# Patient Record
Sex: Female | Born: 1970 | Race: Black or African American | Hispanic: No | Smoking: Current every day smoker
Health system: Southern US, Community
[De-identification: ages and names within clinical notes are randomized; demographics above are authoritative.]

## PROBLEM LIST (undated history)

## (undated) DIAGNOSIS — N200 Calculus of kidney: Secondary | ICD-10-CM

---

## 2016-07-03 ENCOUNTER — Encounter (HOSPITAL_COMMUNITY): Payer: Self-pay | Admitting: Emergency Medicine

## 2016-07-03 ENCOUNTER — Emergency Department (HOSPITAL_COMMUNITY)
Admission: EM | Admit: 2016-07-03 | Discharge: 2016-07-03 | Disposition: A | Discharge status: Home / Self Care | Payer: Self-pay | Attending: Emergency Medicine | Admitting: Emergency Medicine

## 2016-07-03 ENCOUNTER — Emergency Department (HOSPITAL_COMMUNITY): Payer: Self-pay

## 2016-07-03 DIAGNOSIS — N134 Hydroureter: Secondary | ICD-10-CM | POA: Insufficient documentation

## 2016-07-03 LAB — CBC
HCT: 41.6 % (ref 36.0–46.0)
Hemoglobin: 13.2 g/dL (ref 12.0–15.0)
MCH: 24.3 pg — ABNORMAL LOW (ref 26.0–34.0)
MCHC: 31.7 g/dL (ref 30.0–36.0)
MCV: 76.6 fL — ABNORMAL LOW (ref 78.0–100.0)
Platelets: 475 10*3/uL — ABNORMAL HIGH (ref 150–400)
RBC: 5.43 MIL/uL — ABNORMAL HIGH (ref 3.87–5.11)
RDW: 15.3 % (ref 11.5–15.5)
WBC: 7.4 10*3/uL (ref 4.0–10.5)

## 2016-07-03 LAB — PREGNANCY, URINE: Preg Test, Ur: NEGATIVE

## 2016-07-03 LAB — COMPREHENSIVE METABOLIC PANEL
ALBUMIN: 4.2 g/dL (ref 3.5–5.0)
ALK PHOS: 67 U/L (ref 38–126)
ALT: 26 U/L (ref 14–54)
ANION GAP: 7 (ref 5–15)
AST: 20 U/L (ref 15–41)
BILIRUBIN TOTAL: 0.5 mg/dL (ref 0.3–1.2)
BUN: 16 mg/dL (ref 6–20)
CALCIUM: 9.2 mg/dL (ref 8.9–10.3)
CO2: 25 mmol/L (ref 22–32)
Chloride: 108 mmol/L (ref 101–111)
Creatinine, Ser: 0.71 mg/dL (ref 0.44–1.00)
GFR calc Af Amer: >60 mL/min (ref >60)
GLUCOSE: 109 mg/dL — AB (ref 65–99)
Potassium: 4.5 mmol/L (ref 3.5–5.1)
Sodium: 140 mmol/L (ref 135–145)
TOTAL PROTEIN: 7.8 g/dL (ref 6.5–8.1)

## 2016-07-03 LAB — URINALYSIS, ROUTINE W REFLEX MICROSCOPIC
Bacteria, UA: NONE SEEN
Bilirubin Urine: NEGATIVE
GLUCOSE, UA: NEGATIVE mg/dL
Ketones, ur: NEGATIVE mg/dL
NITRITE: NEGATIVE
PH: 5 (ref 5.0–8.0)
Protein, ur: NEGATIVE mg/dL
SPECIFIC GRAVITY, URINE: 1.017 (ref 1.005–1.030)

## 2016-07-03 LAB — LIPASE, BLOOD: Lipase: 26 U/L (ref 11–51)

## 2016-07-03 MED ORDER — HYDROCODONE-ACETAMINOPHEN 5-325 MG PO TABS: 2.0000 | ORAL_TABLET | ORAL | 0 refills | Status: AC | PRN

## 2016-07-03 MED ORDER — ONDANSETRON 4 MG PO TBDP: 4.0000 mg | ORAL_TABLET | Freq: Three times a day (TID) | ORAL | 0 refills | Status: DC | PRN

## 2016-07-03 NOTE — ED Triage Notes (Signed)
Per EMS-states RLQ pain that started 3 hours ago-no N/V

## 2016-07-03 NOTE — ED Provider Notes (Signed)
[facility] Provider Note   CSN: [id number] Arrival date & time: 07/03/16  1137     History   Chief Complaint Chief Complaint  Patient presents with  . Abdominal Pain    HPI Wrenna Saks is a 46 y.o. female.  The history is provided by the patient. No language interpreter was used.  Abdominal Pain   This is a new problem. The problem occurs constantly. The problem has been gradually worsening. The pain is associated with an unknown factor. The pain is located in the RLQ. The pain is at a severity of 10/10. The pain is moderate. Nothing aggravates the symptoms. Nothing relieves the symptoms. Past workup does not include GI consult. Her past medical history does not include gallstones.  Pt had sudden onset of severe pain right lower abdomen.  Pt reports pain was as bad as child birth.  Pt reports has a cramping sensation now but pain is not as severe.  Pt took ibuprofen no initial relief  History reviewed. No pertinent past medical history.  There are no active problems to display for this patient.   History reviewed. No pertinent surgical history.  OB History    No data available       Home Medications    Prior to Admission medications   Medication Sig Start Date End Date Taking? Authorizing Provider  ibuprofen (ADVIL,MOTRIN) 600 MG tablet Take 600 mg by mouth every 6 (six) hours as needed for fever, headache, mild pain, moderate pain or cramping.   Yes Historical Provider, MD  Pseudoeph-CPM-DM-APAP (TYLENOL COLD) 30-2-15-325 MG TABS Take 2 tablets by mouth every 4 (four) hours as needed (for cold).   Yes Historical Provider, MD    Family History No family history on file.  Social History Social History  Substance Use Topics  . Smoking status: Never Smoker  . Smokeless tobacco: Not on file  . Alcohol use No     Allergies   Patient has no known allergies.   Review of Systems Review of Systems  Gastrointestinal: Positive for abdominal pain.  All other  systems reviewed and are negative.    Physical Exam Updated Vital Signs BP 143/94 (BP Location: Left Arm)   Pulse 85   Temp 98.1 F (36.7 C)   Resp 18   SpO2 100%   Physical Exam  Constitutional: She is oriented to person, place, and time. She appears well-developed and well-nourished.  HENT:  Head: Normocephalic and atraumatic.  Eyes: EOM are normal. Pupils are equal, round, and reactive to light.  Neck: Normal range of motion.  Cardiovascular: Normal rate and regular rhythm.   Pulmonary/Chest: Effort normal.  Abdominal: Soft.  Musculoskeletal: Normal range of motion.  Neurological: She is alert and oriented to person, place, and time.  Skin: Skin is warm.  Psychiatric: She has a normal mood and affect.  Nursing note and vitals reviewed.    ED Treatments / Results  Labs (all labs ordered are listed, but only abnormal results are displayed) Labs Reviewed  COMPREHENSIVE METABOLIC PANEL - Abnormal; Notable for the following:       Result Value   Glucose, Bld 109 (*)    All other components within normal limits  CBC - Abnormal; Notable for the following:    RBC 5.43 (*)    MCV 76.6 (*)    MCH 24.3 (*)    Platelets 475 (*)    All other components within normal limits  URINALYSIS, ROUTINE W REFLEX MICROSCOPIC - Abnormal; Notable for the  following:    Hgb urine dipstick LARGE (*)    Leukocytes, UA MODERATE (*)    Squamous Epithelial / LPF 0-5 (*)    All other components within normal limits  LIPASE, BLOOD    EKG  EKG Interpretation None       Radiology Ct Renal Stone Study  Result Date: 07/03/2016 CLINICAL DATA:  Right flank pain.  Intermittent. EXAM: CT ABDOMEN AND PELVIS WITHOUT CONTRAST TECHNIQUE: Multidetector CT imaging of the abdomen and pelvis was performed following the standard protocol without IV contrast. COMPARISON:  None. FINDINGS: Lower chest: The lung bases are clear. No pleural or pericardial effusion. Hepatobiliary: No focal liver abnormality.  The gallbladder appears normal. No biliary dilatation. Pancreas: Unremarkable. No pancreatic ductal dilatation or surrounding inflammatory changes. Spleen: Normal in size without focal abnormality. Adrenals/Urinary Tract: Normal adrenal glands. Right-sided mild right-sided pelvocaliectasis and mild hydroureter. Mild perinephric and periureteral fat stranding is also noted. No ureteral calculi or bladder calculi noted. Normal appearance of the left kidney. The urinary bladder is normal. Stomach/Bowel: Stomach is within normal limits. Appendix appears normal. No evidence of bowel wall thickening, distention, or inflammatory changes. Vascular/Lymphatic: No significant vascular findings are present. No enlarged abdominal or pelvic lymph nodes. Reproductive: The uterus and adnexal structures appear normal. Other: No abdominal wall hernia or abnormality. No abdominopelvic ascites. Musculoskeletal: No acute or significant osseous findings. IMPRESSION: 1. Right sided perinephric and periureteral fat stranding along with right pelvocaliectasis and mild hydroureter is identified. Findings may reflect sequelae of recently passed calculus or urinary tract infection. Clinical correlation is advised. At this time, there is no urinary tract calculi identified. Electronically Signed   By: Signa Kellaylor  Stroud M.D.   On: 07/03/2016 18:50    Procedures Procedures (including critical care time)  Medications Ordered in ED Medications - No data to display   Initial Impression / Assessment and Plan / ED Course  I have reviewed the triage vital signs and the nursing notes.  Pertinent labs & imaging results that were available during my care of the patient were reviewed by me and considered in my medical decision making (see chart for details).  Clinical Course    Pt has hematuria.  Ct scan shows right sided perinephric and periureteral stranding.  No stone seen.   Radiologist suspects recently passed stone   Final Clinical  Impressions(s) / ED Diagnoses   Final diagnoses:  Hydroureter on right    New Prescriptions New Prescriptions   HYDROCODONE-ACETAMINOPHEN (NORCO/VICODIN) 5-325 MG TABLET    Take 2 tablets by mouth every 4 (four) hours as needed.   ONDANSETRON (ZOFRAN ODT) 4 MG DISINTEGRATING TABLET    Take 1 tablet (4 mg total) by mouth every 8 (eight) hours as needed for nausea or vomiting.     Lonia SkinnerLeslie K DilkonSofia, PA-C 07/03/16 1940    Azalia BilisKevin Campos, MD 07/04/16 [phone]

## 2016-07-03 NOTE — Progress Notes (Signed)
CM spoke with pt who [place].  CM discussed and provided written information to assist pt with determining choice for uninsured accepting pcps, discussed the importance of pcp vs EDP services for f/u care, www.needymeds.org, www.goodrx.com, discounted pharmacies and other [place] resources such as [facility] , [facility], affordable care act, financial assistance, uninsured dental services, [place] med assist, DSS and  health department  Reviewed resources for [place] uninsured accepting pcps like [facility], [facility] medicine [facility] [address], [facility], [facility], local urgent care centers, [facility], [facility], general medical clinics, [facility], [facility] plus others, medication resources, [facility] out patient [facility]ies and housing Pt voiced understanding and appreciation of resources provided  Provided [facility] contact information  Pt [place] in [place] last "[place]" and is not eligible for [facility] until after 3 months

## 2016-07-03 NOTE — Progress Notes (Signed)
Entered in d/c instructions  Please use the resources provided to you in emergency room by case manager to assist in your choice of doctor for follow up  These [place] uninsured resources provide possible primary care providers, resources for discounted medications, housing, dental resources, affordable care act information, plus other resources for [place]    If immediate follow up services is needed Please call one of the doctors listed on the first two pages

## 2017-03-26 ENCOUNTER — Emergency Department (HOSPITAL_COMMUNITY): Admission: EM | Admit: 2017-03-26 | Discharge: 2017-03-26 | Discharge status: Absent without leave | Payer: Self-pay

## 2017-03-26 ENCOUNTER — Encounter (HOSPITAL_COMMUNITY): Payer: Self-pay | Admitting: Emergency Medicine

## 2017-03-26 DIAGNOSIS — R1031 Right lower quadrant pain: Secondary | ICD-10-CM | POA: Insufficient documentation

## 2017-03-26 DIAGNOSIS — Z5321 Procedure and treatment not carried out due to patient leaving prior to being seen by health care provider: Secondary | ICD-10-CM | POA: Insufficient documentation

## 2017-03-26 LAB — URINALYSIS, ROUTINE W REFLEX MICROSCOPIC
Bilirubin Urine: NEGATIVE
GLUCOSE, UA: NEGATIVE mg/dL
Ketones, ur: NEGATIVE mg/dL
NITRITE: NEGATIVE
PH: 5 (ref 5.0–8.0)
Protein, ur: NEGATIVE mg/dL
SPECIFIC GRAVITY, URINE: 1.029 (ref 1.005–1.030)

## 2017-03-26 LAB — COMPREHENSIVE METABOLIC PANEL
ALK PHOS: 71 U/L (ref 38–126)
ALT: 19 U/L (ref 14–54)
AST: 21 U/L (ref 15–41)
Albumin: 4 g/dL (ref 3.5–5.0)
Anion gap: 9 (ref 5–15)
BILIRUBIN TOTAL: 0.6 mg/dL (ref 0.3–1.2)
BUN: 12 mg/dL (ref 6–20)
CALCIUM: 9.4 mg/dL (ref 8.9–10.3)
CHLORIDE: 106 mmol/L (ref 101–111)
CO2: 23 mmol/L (ref 22–32)
CREATININE: 0.93 mg/dL (ref 0.44–1.00)
Glucose, Bld: 135 mg/dL — ABNORMAL HIGH (ref 65–99)
Potassium: 4.6 mmol/L (ref 3.5–5.1)
Sodium: 138 mmol/L (ref 135–145)
TOTAL PROTEIN: 7.5 g/dL (ref 6.5–8.1)

## 2017-03-26 LAB — CBC
HCT: 41.4 % (ref 36.0–46.0)
Hemoglobin: 13.2 g/dL (ref 12.0–15.0)
MCH: 24.5 pg — ABNORMAL LOW (ref 26.0–34.0)
MCHC: 31.9 g/dL (ref 30.0–36.0)
MCV: 76.8 fL — ABNORMAL LOW (ref 78.0–100.0)
PLATELETS: 461 10*3/uL — AB (ref 150–400)
RBC: 5.39 MIL/uL — AB (ref 3.87–5.11)
RDW: 15 % (ref 11.5–15.5)
WBC: 14.6 10*3/uL — AB (ref 4.0–10.5)

## 2017-03-26 LAB — LIPASE, BLOOD: LIPASE: 31 U/L (ref 11–51)

## 2017-03-26 LAB — HCG, QUANTITATIVE, PREGNANCY: hCG, Beta Chain, Quant, S: 2 m[IU]/mL (ref <5)

## 2017-03-26 MED ORDER — OXYCODONE-ACETAMINOPHEN 5-325 MG PO TABS
1.0000 | ORAL_TABLET | Freq: Once | ORAL | Status: AC
  Administered 2017-03-26: 1 via ORAL

## 2017-03-26 MED ORDER — ONDANSETRON 4 MG PO TBDP
ORAL_TABLET | ORAL | Status: AC
  Filled 2017-03-26: qty 1

## 2017-03-26 MED ORDER — ONDANSETRON 4 MG PO TBDP
4.0000 mg | ORAL_TABLET | Freq: Once | ORAL | Status: AC | PRN
  Administered 2017-03-26: 4 mg via ORAL

## 2017-03-26 MED ORDER — OXYCODONE-ACETAMINOPHEN 5-325 MG PO TABS
ORAL_TABLET | ORAL | Status: AC
  Filled 2017-03-26: qty 1

## 2017-03-26 NOTE — ED Triage Notes (Signed)
Pt reports RLQ pain with radiation to R flank present since this evening. Pt reports N/V, denies diarrhea. Pt also states this feels similar to her previous kidney stones.

## 2017-03-27 ENCOUNTER — Emergency Department (HOSPITAL_COMMUNITY)
Admission: EM | Admit: 2017-03-27 | Discharge: 2017-03-27 | Disposition: A | Discharge status: Home / Self Care | Payer: Self-pay | Attending: Emergency Medicine | Admitting: Emergency Medicine

## 2017-03-27 HISTORY — DX: Calculus of kidney: N20.0

## 2017-03-27 MED ORDER — IBUPROFEN 400 MG PO TABS
ORAL_TABLET | ORAL | Status: AC
  Filled 2017-03-27: qty 1

## 2017-03-27 NOTE — ED Notes (Signed)
Pt to [facility] requesting pain medicine, offered Motrin for pain since I administered a Percocet earlier. Pt states this was fine

## 2017-03-27 NOTE — ED Notes (Signed)
Pt approached nurse first stating she was feeling better and is suspecting it was a kidney stone which she has a history of; pt states she planned to see her PCP today; pt advised of return precautions; pt ambulatory from lobby with steady gait

## 2018-06-16 ENCOUNTER — Ambulatory Visit (HOSPITAL_COMMUNITY)
Admission: EM | Admit: 2018-06-16 | Discharge: 2018-06-16 | Disposition: A | Discharge status: Home / Self Care | Payer: Self-pay | Attending: Family Medicine | Admitting: Family Medicine

## 2018-06-16 ENCOUNTER — Encounter (HOSPITAL_COMMUNITY): Payer: Self-pay | Admitting: Emergency Medicine

## 2018-06-16 DIAGNOSIS — R42 Dizziness and giddiness: Secondary | ICD-10-CM | POA: Insufficient documentation

## 2018-06-16 DIAGNOSIS — E860 Dehydration: Secondary | ICD-10-CM | POA: Insufficient documentation

## 2018-06-16 MED ORDER — ONDANSETRON HCL 4 MG PO TABS: 4.0000 mg | ORAL_TABLET | Freq: Four times a day (QID) | ORAL | 0 refills | Status: AC

## 2018-06-16 NOTE — ED Triage Notes (Signed)
Pt states she has hx of kidney stones, and she knows she has them, states she passes them twice a year. Pt sees a urologist for them. Pt states she has pain pills. Pt states she sometimes vomits with the kidney stones. States today she felt dizzy and nausea, and worried about her feeling dizzy.

## 2018-06-16 NOTE — ED Provider Notes (Signed)
[facility]    CSN: [id number] Arrival date & time: 06/16/18  1134     History   Chief Complaint Chief Complaint  Patient presents with  . Dizziness    HPI Regina Dunn is a 47 y.o. female.   HPI Patient is here for a spell of dizziness.  She states that she is passing a kidney stone.  She states she does this a couple times a year.  When she feels flank pain she pushes fluids and takes hydrocodone for a couple of days.  They usually pass on their own.  She has recently moved to [facility].  She does not have a PCP or urologist.  Today she states that she felt like she had a kidney stone on the right lower abdomen.  It was radiating to her back.  She took some Vicodin.  She did not have any Zofran.  She has not been drinking.  She had a dizzy spell while she was at home.  This frightened her so she called and over to come in for evaluation.  The dizzy spell has passed.  Now she just has the flank pain.  No hematuria.  No cloudy urine or malodorous urine.  No suprapubic pressure.  No urinary frequency or UTI symptoms.  No fever chills, no nausea vomiting.   Past Medical History:  Diagnosis Date  . Kidney stone     There are no active problems to display for this patient.   History reviewed. No pertinent surgical history.  OB History   No obstetric history on file.      Home Medications    Prior to Admission medications   Medication Sig Start Date End Date Taking? Authorizing Provider  HYDROcodone-acetaminophen (NORCO/VICODIN) 5-325 MG tablet Take 2 tablets by mouth every 4 (four) hours as needed. 07/03/16   Elson Areas, PA-C  ibuprofen (ADVIL,MOTRIN) 600 MG tablet Take 600 mg by mouth every 6 (six) hours as needed for fever, headache, mild pain, moderate pain or cramping.    [provider]  ondansetron (ZOFRAN) 4 MG tablet Take 1 tablet (4 mg total) by mouth every 6 (six) hours. 06/16/18   Eustace Moore, MD  Pseudoeph-CPM-DM-APAP (TYLENOL  COLD) 30-2-15-325 MG TABS Take 2 tablets by mouth every 4 (four) hours as needed (for cold).    [provider]    Family History No family history on file.  Social History Social History   Tobacco Use  . Smoking status: Current Every Day Smoker  Substance Use Topics  . Alcohol use: No  . Drug use: Not on file     Allergies   Patient has no known allergies.   Review of Systems Review of Systems  Constitutional: Negative for chills and fever.  HENT: Negative for ear pain and sore throat.   Eyes: Negative for pain and visual disturbance.  Respiratory: Negative for cough and shortness of breath.   Cardiovascular: Negative for chest pain and palpitations.  Gastrointestinal: Positive for nausea. Negative for abdominal pain and vomiting.  Genitourinary: Positive for flank pain. Negative for difficulty urinating, dysuria and hematuria.  Musculoskeletal: Negative for arthralgias and back pain.  Skin: Negative for color change and rash.  Neurological: Positive for dizziness. Negative for seizures and syncope.  All other systems reviewed and are negative.    Physical Exam Triage Vital Signs ED Triage Vitals  Enc Vitals Group     BP 06/16/18 1252 (!) 145/86     Pulse Rate 06/16/18 1252  83     Resp 06/16/18 1252 16     Temp 06/16/18 1252 98 F (36.7 C)     Temp src --      SpO2 06/16/18 1252 99 %     Weight --      Height --      Head Circumference --      Peak Flow --      Pain Score 06/16/18 1254 0     Pain Loc --      Pain Edu? --      Excl. in GC? --    No data found.  Updated Vital Signs BP (!) 145/86   Pulse 83   Temp 98 F (36.7 C)   Resp 16   SpO2 99%      Physical Exam Constitutional:      General: She is not in acute distress.    Appearance: She is well-developed.  HENT:     Head: Normocephalic and atraumatic.     Mouth/Throat:     Comments: Mouth is dry Eyes:     Conjunctiva/sclera: Conjunctivae normal.     Pupils: Pupils are  equal, round, and reactive to light.  Neck:     Musculoskeletal: Normal range of motion.  Cardiovascular:     Rate and Rhythm: Normal rate and regular rhythm.     Heart sounds: Normal heart sounds.  Pulmonary:     Effort: Pulmonary effort is normal. No respiratory distress.     Breath sounds: Normal breath sounds.  Abdominal:     General: There is no distension.     Palpations: Abdomen is soft.     Tenderness: There is right CVA tenderness.     Comments: Abdomen is soft.  Mild tenderness to palpation in the deep right abdomen.  No guarding or rebound.  Mild CVA tenderness  Musculoskeletal: Normal range of motion.  Skin:    General: Skin is warm and dry.  Neurological:     General: No focal deficit present.     Mental Status: She is alert and oriented to person, place, and time.     Sensory: No sensory deficit.     Coordination: Coordination normal.     Gait: Gait normal.     Deep Tendon Reflexes: Reflexes normal.  Psychiatric:        Mood and Affect: Mood normal.        Thought Content: Thought content normal.      UC Treatments / Results  Labs (all labs ordered are listed, but only abnormal results are displayed) Labs Reviewed - No data to display  EKG None  Radiology No results found.  Procedures Procedures (including critical care time)  Medications Ordered in UC Medications - No data to display  Initial Impression / Assessment and Plan / UC Course  I have reviewed the triage vital signs and the nursing notes.  Pertinent labs & imaging results that were available during my care of the patient were reviewed by me and considered in my medical decision making (see chart for details).     We discussed that she needs to push fluids.  Continue medicines as directed by her prior physician.  I think her dizziness was brief and was related to the dehydration, and the pain medication.  We will refill her Zofran.  I am giving her the number of her PCP and the number for  urology in the area. Final Clinical Impressions(s) / UC Diagnoses   Final diagnoses:  Dizziness  Dehydration     Discharge Instructions     You are mildly dehydrated You must push fluids to pass a kidney stone Take the pain medicine as needed Take with food Caution drowsiness on the pain medicine Follow up with a PCP   ED Prescriptions    Medication Sig Dispense Auth. Provider   ondansetron (ZOFRAN) 4 MG tablet Take 1 tablet (4 mg total) by mouth every 6 (six) hours. 20 tablet Eustace MooreNelson, Nur Rabold Sue, MD     Controlled Substance Prescriptions [place] Controlled Substance Registry consulted? Not Applicable   Eustace MooreNelson, Chinita Schimpf Sue, MD 06/16/18 [phone]

## 2018-06-16 NOTE — Discharge Instructions (Signed)
You are mildly dehydrated You must push fluids to pass a kidney stone Take the pain medicine as needed Take with food Caution drowsiness on the pain medicine Follow up with a PCP

## 2018-07-15 IMAGING — CT CT RENAL STONE PROTOCOL
2 of 3 series · 16 of 46 positions shown, 18 images · non-contrast
Comparison: None.

CLINICAL DATA: Right flank pain.  Intermittent.

EXAM:
CT ABDOMEN AND PELVIS WITHOUT CONTRAST
TECHNIQUE: Multidetector CT imaging of the abdomen and pelvis was performed
following the standard protocol without IV contrast.

[Series 3: coronal · coronal · 0.74mm/px · 3 of 151 slices shown]
[im 51/151  soft-tissue]
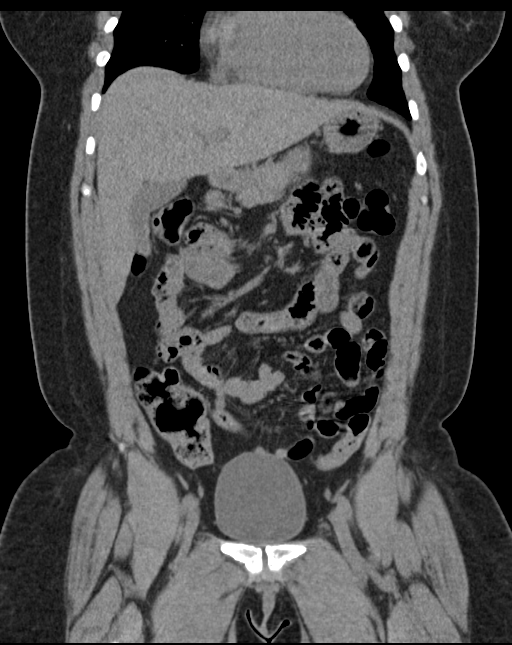
[im 67/151  soft-tissue]
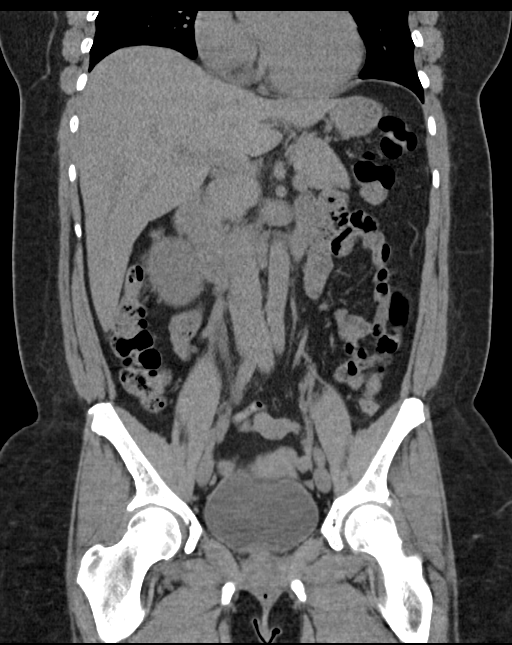
[im 84/151  soft-tissue]
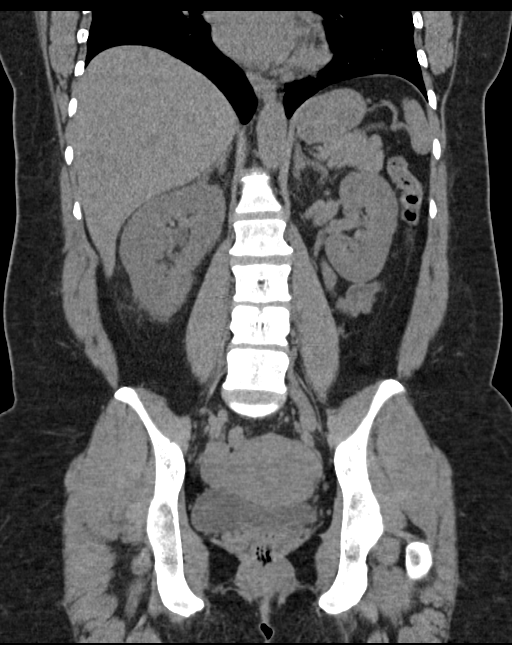

[Series 5: lung · axial · 0.87mm/px · z∈[-704,-478]mm · 13 of 131 slices shown, 15 images]
[im 9/131  soft-tissue]
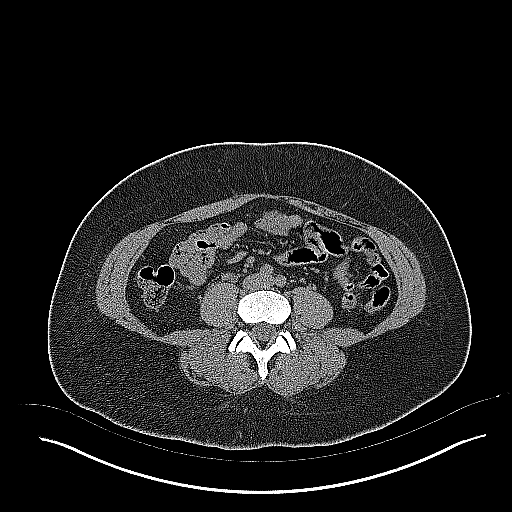
[im 9/131  bone]
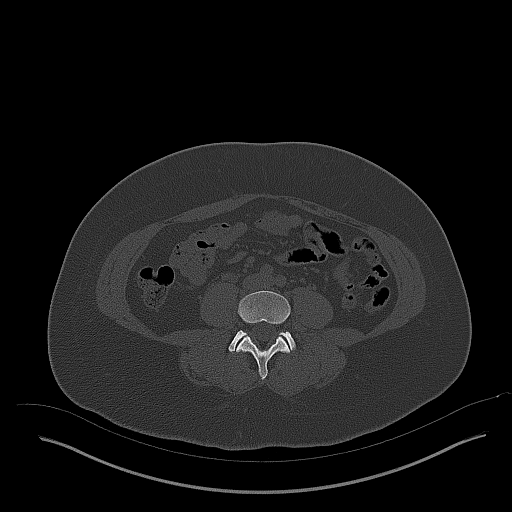
[im 17/131  soft-tissue]
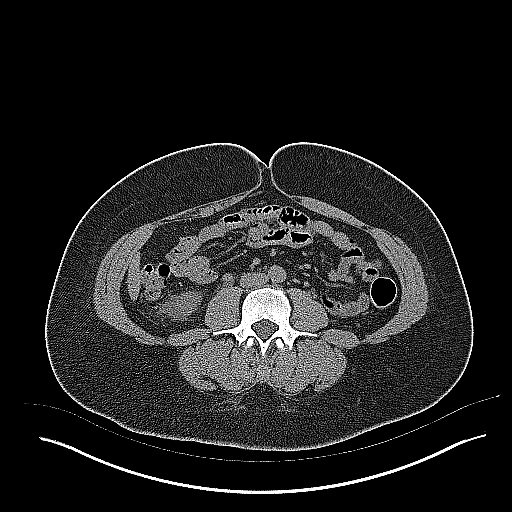
[im 26/131  soft-tissue]
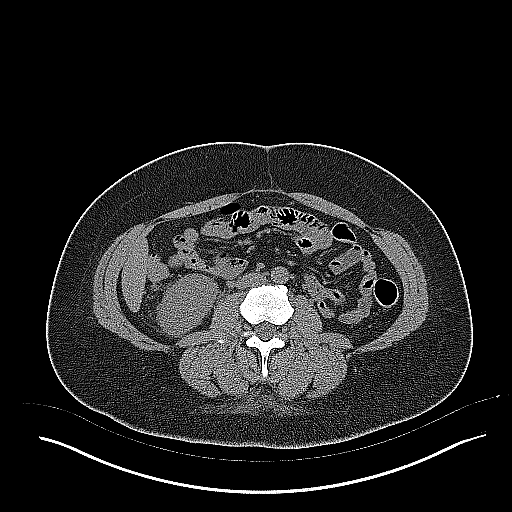
[im 38/131  soft-tissue]
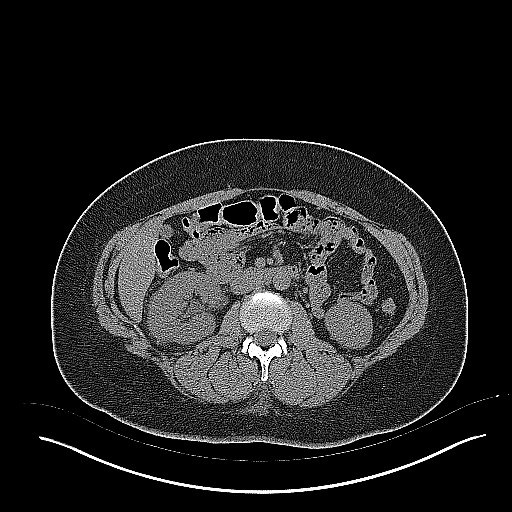
[im 47/131  soft-tissue]
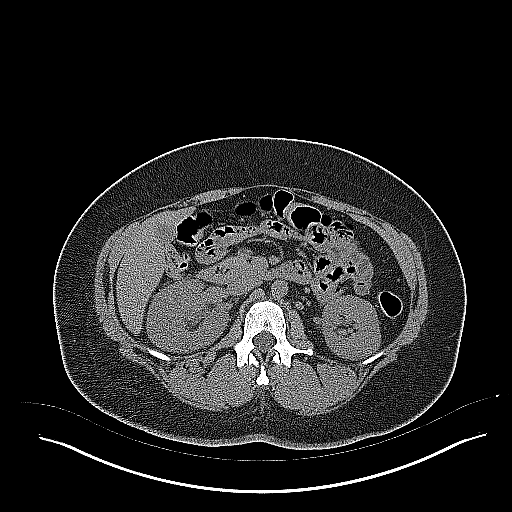
[im 55/131  soft-tissue]
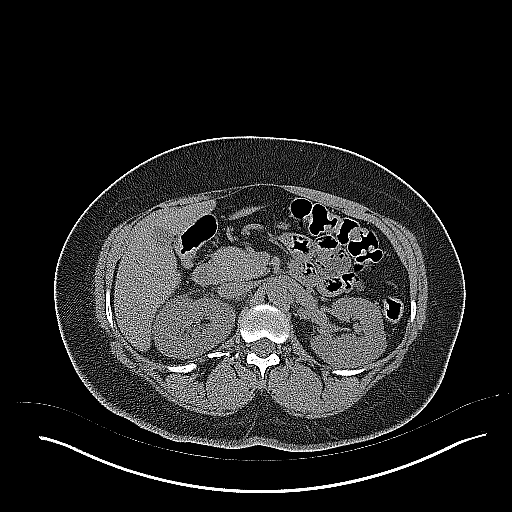
[im 68/131  soft-tissue]
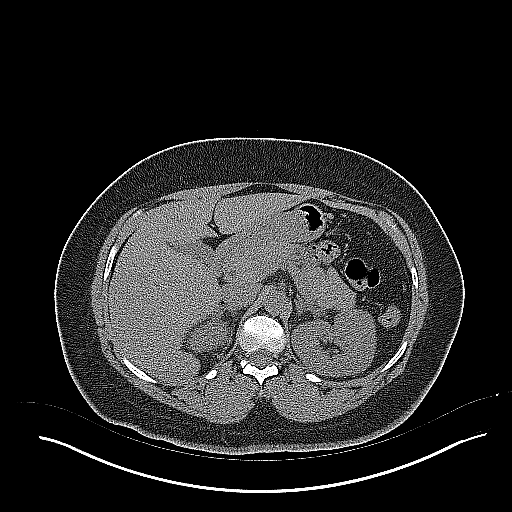
[im 76/131  soft-tissue]
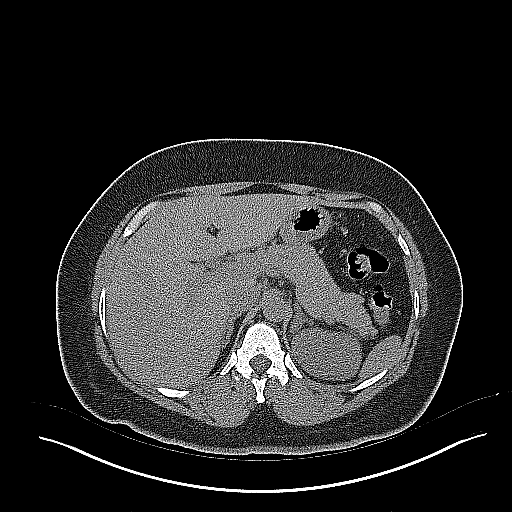
[im 84/131  soft-tissue]
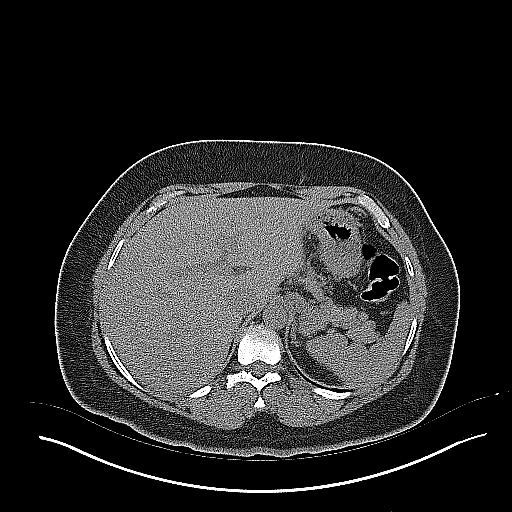
[im 84/131  bone]
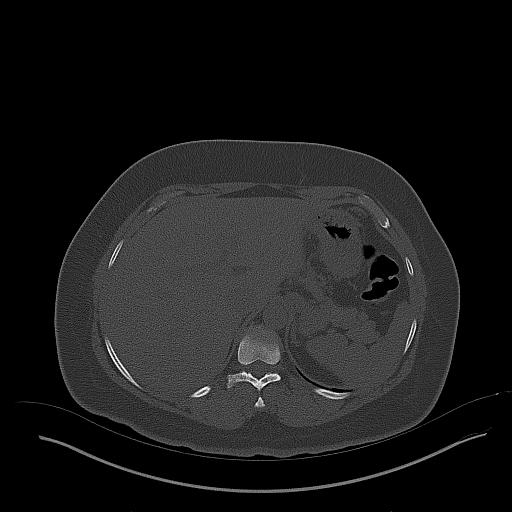
[im 93/131  soft-tissue]
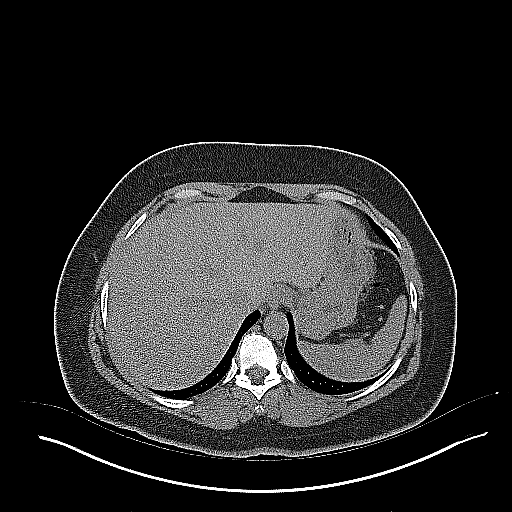
[im 105/131  soft-tissue]
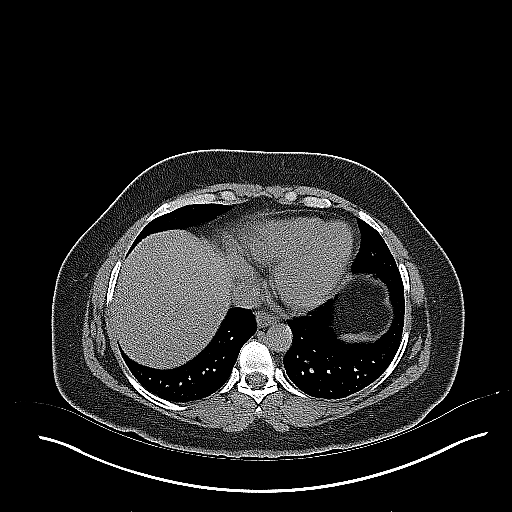
[im 114/131  soft-tissue]
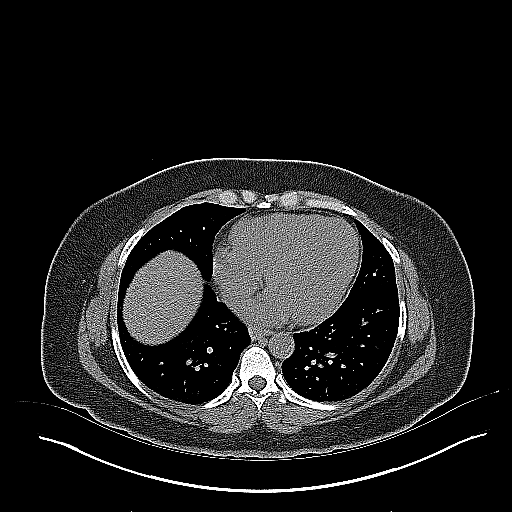
[im 122/131  soft-tissue]
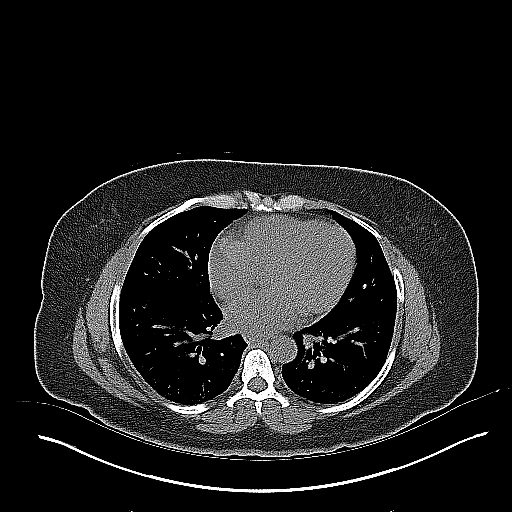

[16 of 46 positions shown; findings below may reference images not displayed]

FINDINGS: Lower chest: The lung bases are clear. No pleural or pericardial
effusion.

Hepatobiliary: No focal liver abnormality. The gallbladder appears
normal. No biliary dilatation.

Pancreas: Unremarkable. No pancreatic ductal dilatation or
surrounding inflammatory changes.

Spleen: Normal in size without focal abnormality.

Adrenals/Urinary Tract: Normal adrenal glands. Right-sided mild
right-sided pelvocaliectasis and mild hydroureter. Mild perinephric
and periureteral fat stranding is also noted. No ureteral calculi or
bladder calculi noted. Normal appearance of the left kidney. The
urinary bladder is normal.

Stomach/Bowel: Stomach is within normal limits. Appendix appears
normal. No evidence of bowel wall thickening, distention, or
inflammatory changes.

Vascular/Lymphatic: No significant vascular findings are present. No
enlarged abdominal or pelvic lymph nodes.

Reproductive: The uterus and adnexal structures appear normal.

Other: No abdominal wall hernia or abnormality. No abdominopelvic
ascites.

Musculoskeletal: No acute or significant osseous findings.
IMPRESSION: 1. Right sided perinephric and periureteral fat stranding along with
right pelvocaliectasis and mild hydroureter is identified. Findings
may reflect sequelae of recently passed calculus or urinary tract
infection. Clinical correlation is advised. At this time, there is
no urinary tract calculi identified.

## 2018-07-21 ENCOUNTER — Ambulatory Visit (HOSPITAL_COMMUNITY)
Admission: EM | Admit: 2018-07-21 | Discharge: 2018-07-21 | Disposition: A | Discharge status: Home / Self Care | Payer: Self-pay | Attending: Family Medicine | Admitting: Family Medicine

## 2018-07-21 NOTE — ED Notes (Signed)
No answer

## 2018-07-21 NOTE — ED Notes (Signed)
Pt called x 3 no answer 

## 2018-07-21 NOTE — ED Notes (Signed)
Pt has been called twice got no answer.

## 2019-09-12 ENCOUNTER — Ambulatory Visit: Payer: Medicaid Other | Attending: Internal Medicine

## 2019-09-12 ENCOUNTER — Other Ambulatory Visit: Payer: Self-pay

## 2019-09-12 DIAGNOSIS — Z23 Encounter for immunization: Secondary | ICD-10-CM

## 2019-09-12 NOTE — Progress Notes (Signed)
   Covid-19 Vaccination Clinic  Name:  Regina Dunn    MRN: [id number] DOB: [birth year]  09/12/2019  Regina Dunn was observed post Covid-19 immunization for 15 minutes without incident. She was provided with Vaccine Information Sheet and instruction to access the V-Safe system.   Regina Dunn was instructed to call 911 with any severe reactions post vaccine: Marland Kitchen Difficulty breathing  . Swelling of face and throat  . A fast heartbeat  . A bad rash all over body  . Dizziness and weakness   Immunizations Administered    Name Date Dose VIS Date Route   Pfizer COVID-19 Vaccine 09/12/2019 12:29 PM 0.3 mL 06/12/2019 Intramuscular   Manufacturer: [employer], [employer]   Lot: [id number]   NDC: [address]-8

## 2019-10-07 ENCOUNTER — Ambulatory Visit: Payer: Medicaid Other | Attending: Internal Medicine

## 2019-10-07 DIAGNOSIS — Z23 Encounter for immunization: Secondary | ICD-10-CM

## 2019-10-07 NOTE — Progress Notes (Signed)
   Covid-19 Vaccination Clinic  Name:  Regina Dunn    MRN: [id number] DOB: August 08, 1970  10/07/2019  [name] was observed post Covid-19 immunization for 15 minutes without incident. She was provided with Vaccine Information Sheet and instruction to access the V-Safe system.   [name] was instructed to call 911 with any severe reactions post vaccine: Marland Kitchen Difficulty breathing  . Swelling of face and throat  . A fast heartbeat  . A bad rash all over body  . Dizziness and weakness   Immunizations Administered    Name Date Dose VIS Date Route   Pfizer COVID-19 Vaccine 10/07/2019  4:23 PM 0.3 mL 06/12/2019 Intramuscular   Manufacturer: [employer], [employer]   Lot: [id number]   NDC: [address]-4
